# Patient Record
Sex: Female | Born: 1970 | Race: Asian | Hispanic: No | Marital: Married | Smoking: Current every day smoker
Health system: Southern US, Community
[De-identification: ages and names within clinical notes are randomized; demographics above are authoritative.]

## PROBLEM LIST (undated history)

## (undated) DIAGNOSIS — N200 Calculus of kidney: Secondary | ICD-10-CM

---

## 2014-11-16 ENCOUNTER — Emergency Department (HOSPITAL_COMMUNITY)
Admission: EM | Admit: 2014-11-16 | Discharge: 2014-11-17 | Disposition: A | Discharge status: Home / Self Care | Payer: PRIVATE HEALTH INSURANCE | Attending: Emergency Medicine | Admitting: Emergency Medicine

## 2014-11-16 ENCOUNTER — Encounter (HOSPITAL_COMMUNITY): Payer: Self-pay | Admitting: Emergency Medicine

## 2014-11-16 ENCOUNTER — Emergency Department (HOSPITAL_COMMUNITY): Payer: PRIVATE HEALTH INSURANCE

## 2014-11-16 DIAGNOSIS — Z720 Tobacco use: Secondary | ICD-10-CM | POA: Insufficient documentation

## 2014-11-16 DIAGNOSIS — R109 Unspecified abdominal pain: Secondary | ICD-10-CM

## 2014-11-16 DIAGNOSIS — N201 Calculus of ureter: Secondary | ICD-10-CM | POA: Diagnosis not present

## 2014-11-16 DIAGNOSIS — Z87442 Personal history of urinary calculi: Secondary | ICD-10-CM | POA: Diagnosis not present

## 2014-11-16 DIAGNOSIS — N1330 Unspecified hydronephrosis: Secondary | ICD-10-CM | POA: Diagnosis not present

## 2014-11-16 DIAGNOSIS — Z3202 Encounter for pregnancy test, result negative: Secondary | ICD-10-CM | POA: Diagnosis not present

## 2014-11-16 DIAGNOSIS — N132 Hydronephrosis with renal and ureteral calculous obstruction: Secondary | ICD-10-CM

## 2014-11-16 HISTORY — DX: Calculus of kidney: N20.0

## 2014-11-16 LAB — CBC
HEMATOCRIT: 37 % (ref 36.0–46.0)
Hemoglobin: 12.3 g/dL (ref 12.0–15.0)
MCH: 29.6 pg (ref 26.0–34.0)
MCHC: 33.2 g/dL (ref 30.0–36.0)
MCV: 89.2 fL (ref 78.0–100.0)
Platelets: 326 10*3/uL (ref 150–400)
RBC: 4.15 MIL/uL (ref 3.87–5.11)
RDW: 15 % (ref 11.5–15.5)
WBC: 11.1 10*3/uL — ABNORMAL HIGH (ref 4.0–10.5)

## 2014-11-16 LAB — URINALYSIS, ROUTINE W REFLEX MICROSCOPIC
BILIRUBIN URINE: NEGATIVE
Glucose, UA: NEGATIVE mg/dL
Ketones, ur: 15 mg/dL — AB
NITRITE: NEGATIVE
PH: 7 (ref 5.0–8.0)
PROTEIN: 30 mg/dL — AB
SPECIFIC GRAVITY, URINE: 1.02 (ref 1.005–1.030)
Urobilinogen, UA: 1 mg/dL (ref 0.0–1.0)

## 2014-11-16 LAB — COMPREHENSIVE METABOLIC PANEL
ALBUMIN: 3.7 g/dL (ref 3.5–5.0)
ALT: 13 U/L — ABNORMAL LOW (ref 14–54)
AST: 22 U/L (ref 15–41)
Alkaline Phosphatase: 57 U/L (ref 38–126)
Anion gap: 9 (ref 5–15)
BUN: 12 mg/dL (ref 6–20)
CO2: 28 mmol/L (ref 22–32)
Calcium: 9.5 mg/dL (ref 8.9–10.3)
Chloride: 101 mmol/L (ref 101–111)
Creatinine, Ser: 0.66 mg/dL (ref 0.44–1.00)
GFR calc Af Amer: >60 mL/min (ref >60)
Glucose, Bld: 93 mg/dL (ref 65–99)
Potassium: 3.5 mmol/L (ref 3.5–5.1)
SODIUM: 138 mmol/L (ref 135–145)
Total Bilirubin: 0.5 mg/dL (ref 0.3–1.2)
Total Protein: 6.3 g/dL — ABNORMAL LOW (ref 6.5–8.1)

## 2014-11-16 LAB — URINE MICROSCOPIC-ADD ON

## 2014-11-16 LAB — POC URINE PREG, ED: Preg Test, Ur: NEGATIVE

## 2014-11-16 LAB — LIPASE, BLOOD: Lipase: 21 U/L — ABNORMAL LOW (ref 22–51)

## 2014-11-16 MED ORDER — ONDANSETRON HCL 4 MG/2ML IJ SOLN
4.0000 mg | Freq: Once | INTRAMUSCULAR | Status: AC
  Administered 2014-11-16: 4 mg via INTRAVENOUS
  Filled 2014-11-16: qty 2

## 2014-11-16 MED ORDER — HYDROMORPHONE HCL 1 MG/ML IJ SOLN
1.0000 mg | Freq: Once | INTRAMUSCULAR | Status: AC
  Administered 2014-11-16: 1 mg via INTRAVENOUS
  Filled 2014-11-16: qty 1

## 2014-11-16 MED ORDER — HYDROMORPHONE HCL 1 MG/ML IJ SOLN
1.0000 mg | INTRAMUSCULAR | Status: DC | PRN
  Administered 2014-11-16: 1 mg via INTRAVENOUS
  Filled 2014-11-16: qty 1

## 2014-11-16 NOTE — ED Provider Notes (Signed)
CSN: [id number]     Arrival date & time 11/16/14  2056 History   First MD Initiated Contact with Patient 11/16/14 2210     Chief Complaint  Patient presents with  . Flank Pain     (Consider location/radiation/quality/duration/timing/severity/associated sxs/prior Treatment) HPI   PCP: No primary care provider on file. Blood pressure 115/73, pulse 89, temperature 98.1 F (36.7 C), temperature source Oral, resp. rate 24, height  (1.626 m), weight 120 lb (54.432 kg), last menstrual period 11/13/2014, SpO2 97 %.  Tiffany Hodges is a 44 y.o.female with a significant PMH of kidney stones presents to the ER with complaints of acute onset of flank pain this evening, urinary hesitency, and nausea without vomiting.  She was writhing in pain and given a dose of pain medication in triage, this helped her pain relieve to a 9/10 from a 10/10. Her nausea is also improved but has not resolved. The patient speaks [place] and is here visiting family from [place]. She has a hx of renal stones and passing stones in the past.   The patient denies diaphoresis, fever, headache, weakness (general or focal), confusion, change of vision,  neck pain, dysphagia, aphagia, chest pain, shortness of breath,  back pain,vomiting, diarrhea, lower extremity swelling, rash.   Past Medical History  Diagnosis Date  . Renal stones    History reviewed. No pertinent past surgical history. No family history on file. History  Substance Use Topics  . Smoking status: Current Every Day Smoker  . Smokeless tobacco: Not on file  . Alcohol Use: Yes   OB History    No data available     Review of Systems  10 Systems reviewed and are negative for acute change except as noted in the HPI.   Allergies  Review of patient's allergies indicates no known allergies.  Home Medications   Prior to Admission medications   Medication Sig Start Date End Date Taking? Authorizing Provider  ciprofloxacin (CIPRO) 500 MG tablet Take 1  tablet (500 mg total) by mouth 2 (two) times daily. 11/17/14   Mistie Adney Neva Seat, PA-C  ondansetron (ZOFRAN) 4 MG tablet Take 1 tablet (4 mg total) by mouth every 6 (six) hours. 11/17/14   Marlon Pel, PA-C  oxyCODONE-acetaminophen (PERCOCET/ROXICET) 5-325 MG per tablet Take 1-2 tablets by mouth every 6 (six) hours as needed. 11/17/14   Keone Kamer Neva Seat, PA-C   BP 114/84 mmHg  Pulse 68  Temp(Src) 98.1 F (36.7 C) (Oral)  Resp 24  Ht  (1.626 m)  Wt 120 lb (54.432 kg)  BMI 20.59 kg/m2  SpO2 95%  LMP 11/13/2014 Physical Exam  Constitutional: She appears well-developed and well-nourished. No distress.  HENT:  Head: Normocephalic and atraumatic.  Eyes: Pupils are equal, round, and reactive to light.  Neck: Normal range of motion. Neck supple.  Cardiovascular: Normal rate and regular rhythm.   Pulmonary/Chest: Effort normal.  Abdominal: Soft. Bowel sounds are normal. There is no tenderness. There is CVA tenderness (left). There is no rigidity and no guarding.  Neurological: She is alert.  Skin: Skin is warm and dry. She is not diaphoretic.  Nursing note and vitals reviewed.   ED Course  Procedures (including critical care time) Labs Review Labs Reviewed  COMPREHENSIVE METABOLIC PANEL - Abnormal; Notable for the following:    Total Protein 6.3 (*)    ALT 13 (*)    All other components within normal limits  CBC - Abnormal; Notable for the following:    WBC 11.1 (*)  All other components within normal limits  LIPASE, BLOOD - Abnormal; Notable for the following:    Lipase 21 (*)    All other components within normal limits  URINALYSIS, ROUTINE W REFLEX MICROSCOPIC (NOT AT [facility]) - Abnormal; Notable for the following:    Color, Urine RED (*)    APPearance TURBID (*)    Hgb urine dipstick LARGE (*)    Ketones, ur 15 (*)    Protein, ur 30 (*)    Leukocytes, UA MODERATE (*)    All other components within normal limits  URINE CULTURE  URINE MICROSCOPIC-ADD ON  POC URINE PREG, ED     Imaging Review Ct Abdomen Pelvis Wo Contrast  11/16/2014   CLINICAL DATA:  Acute onset of worsening left flank pain. Initial encounter.  EXAM: CT ABDOMEN AND PELVIS WITHOUT CONTRAST  TECHNIQUE: Multidetector CT imaging of the abdomen and pelvis was performed following the standard protocol without IV contrast.  COMPARISON:  None.  FINDINGS: Minimal bibasilar atelectasis is noted.  The liver and spleen are unremarkable in appearance. The gallbladder is within normal limits. The pancreas and adrenal glands are unremarkable.  There is mild left-sided hydronephrosis, with minimal left-sided perinephric stranding, and prominence of the left ureter along its entire course. An obstructing 6 x 5 mm stone is noted in the distal left ureter, 1-2 cm above the left vesicoureteral junction.  The right kidney is unremarkable in appearance. No nonobstructing renal stones are identified.  No free fluid is identified. The small bowel is unremarkable in appearance. The stomach is within normal limits. No acute vascular abnormalities are seen.  The appendix is normal in caliber and contains trace air, without evidence of appendicitis. The colon is unremarkable in appearance.  The bladder is relatively decompressed and grossly unremarkable. The uterus is within normal limits. The ovaries are relatively symmetric. No suspicious adnexal masses are seen. No inguinal lymphadenopathy is seen.  No acute osseous abnormalities are identified.  IMPRESSION: Mild left-sided hydronephrosis, with prominence of the left ureter along its entire course. Obstructing 6 x 5 mm stone in the distal left ureter, 1-2 cm above the left vesicoureteral junction.   Electronically Signed   By: Roanna Raider M.D.   On: 11/16/2014 23:41     EKG Interpretation None      MDM   Final diagnoses:  Left flank pain  Ureteral stone with hydronephrosis    Pt has moderate Leukocytes but negative nitrites and only 3-6 WBC, due to stone will cover with  Cipro. Her CT scan shows 6 x 5 mm stone near the distal ureter. Mild hydronephrosis associated. The patient is returning home to [place] in a few days.  Pain controlled in the ED with IV analgesics.   Her labs are otherwise unremarkable.  Medications  oxyCODONE-acetaminophen (PERCOCET/ROXICET) 5-325 MG per tablet 2 tablet (not administered)  ondansetron (ZOFRAN) injection 4 mg (4 mg Intravenous Given 11/16/14 2133)  HYDROmorphone (DILAUDID) injection 1 mg (1 mg Intravenous Given 11/16/14 2250)  ondansetron (ZOFRAN) injection 4 mg (4 mg Intravenous Given 11/16/14 2250)    44 y.o.Tiffany Hodges's evaluation in the Emergency Department is complete. It has been determined that no acute conditions requiring further emergency intervention are present at this time. The patient/guardian have been advised of the diagnosis and plan. We have discussed signs and symptoms that warrant return to the ED, such as changes or worsening in symptoms.  Vital signs are stable at discharge. Filed Vitals:   11/16/14 2329  BP:  Pulse: 68  Temp:   Resp:     Patient/guardian has voiced understanding and agreed to follow-up with the PCP or specialist.     Marlon Pel, PA-C 11/17/14 0005  Linwood Dibbles, MD 11/17/14 1228

## 2014-11-16 NOTE — ED Notes (Signed)
Pt. reports worsening left flank pain/left lateral abdominal pain with nausea onset this week , history of renal stones , denies hematuria / mild dysuria , no fever or chills.

## 2014-11-17 DIAGNOSIS — N201 Calculus of ureter: Secondary | ICD-10-CM | POA: Diagnosis not present

## 2014-11-17 MED ORDER — ONDANSETRON HCL 4 MG PO TABS: 4.0000 mg | ORAL_TABLET | Freq: Four times a day (QID) | ORAL | Status: DC

## 2014-11-17 MED ORDER — OXYCODONE-ACETAMINOPHEN 5-325 MG PO TABS: 1.0000 | ORAL_TABLET | Freq: Four times a day (QID) | ORAL | Status: AC | PRN

## 2014-11-17 MED ORDER — METOCLOPRAMIDE HCL 5 MG/ML IJ SOLN
10.0000 mg | Freq: Once | INTRAMUSCULAR | Status: AC
  Administered 2014-11-17: 10 mg via INTRAVENOUS
  Filled 2014-11-17: qty 2

## 2014-11-17 MED ORDER — PROMETHAZINE HCL 25 MG/ML IJ SOLN
25.0000 mg | Freq: Once | INTRAMUSCULAR | Status: DC
  Filled 2014-11-17: qty 1

## 2014-11-17 MED ORDER — OXYCODONE-ACETAMINOPHEN 5-325 MG PO TABS
2.0000 | ORAL_TABLET | Freq: Once | ORAL | Status: DC
  Filled 2014-11-17: qty 2

## 2014-11-17 MED ORDER — OXYCODONE-ACETAMINOPHEN 5-325 MG PO TABS: 1.0000 | ORAL_TABLET | Freq: Four times a day (QID) | ORAL | Status: DC | PRN

## 2014-11-17 MED ORDER — PROMETHAZINE HCL 25 MG/ML IJ SOLN
12.5000 mg | Freq: Once | INTRAMUSCULAR | Status: DC
  Filled 2014-11-17: qty 1

## 2014-11-17 MED ORDER — CIPROFLOXACIN HCL 500 MG PO TABS: 500.0000 mg | ORAL_TABLET | Freq: Two times a day (BID) | ORAL | Status: DC

## 2014-11-17 MED ORDER — ONDANSETRON HCL 4 MG PO TABS: 4.0000 mg | ORAL_TABLET | Freq: Four times a day (QID) | ORAL | Status: AC

## 2014-11-17 MED ORDER — CIPROFLOXACIN HCL 500 MG PO TABS: 500.0000 mg | ORAL_TABLET | Freq: Two times a day (BID) | ORAL | Status: AC

## 2014-11-17 NOTE — Discharge Instructions (Signed)
Hydronephrosis °Hydronephrosis is an abnormal enlargement of your kidney. It can affect one or both the kidneys. It results from the backward pressure of urine on the kidneys, when the flow of urine is blocked. Normally, the urine drains from the kidney through the urine tube (ureter), into a sac which holds the urine until urination (bladder). When the urinary flow is blocked, the urine collects above the block. This causes an increase in the pressure inside the kidney, which in turn leads to its enlargement. The block can occur at the point where the kidney joins the ureter. Treatment depends on the cause and location of the block.  °CAUSES  °The causes of this condition include: °· Birth defect of the kidney or ureter. °· Kink at the point where the kidney joins the ureter. °· Stones and blood clots in the kidney or ureter. °· Cancer, injury, or infection of the ureter. °· Scar tissue formation. °· Backflow of urine (reflux). °· Cancer of bladder or prostate gland. °· Abnormality of the nerves or muscles of the kidney or ureter. °· Lower part of the ureter protruding into the bladder (ureterocele). °· Abnormal contractions of the bladder. °· Both the kidneys can be affected during pregnancy. This is because the enlarging uterus presses on the ureters and blocks the flow of urine. °SYMPTOMS  °The symptoms depend on the location of the block. They also depend on how long the block has been present. You may feel pain on the affected side. Sometimes, you may not have any symptoms. There may be a dull ache or discomfort in the flank. The common symptoms are: °· Flank pain. °· Swelling of the abdomen. °· Pain in the abdomen. °· Nausea and vomiting. °· Fever. °· Pain while passing urine. °· Urgency for urination. °· Frequent or urgent urination. °· Infection of the urinary tract. °DIAGNOSIS  °Your caregiver will examine you after asking about your symptoms. You may be asked to do blood and urine tests. Your caregiver  may order a special X-ray, ultrasound, or CT scan. Sometimes a rigid or flexible telescope (cystoscope) is used to view the site of the blockage.  °TREATMENT  °Treatment depends on the site, cause, and duration of the block. The goal of treatment is to remove the blockage. Your caregiver will plan the treatment based on your condition. The different types of treatment are:  °· Putting in a soft plastic tube (ureteral stent) to connect the bladder with the kidney. This will help in draining the urine. °· Putting in a soft tube (nephrostomy tube). This is placed through skin into the kidney. The trapped urine is drained out through the back. A plastic bag is attached to your skin to hold the urine that has drained out. °· Antibiotics to treat or prevent infection. °· Breaking down of the stone (lithotripsy). °HOME CARE INSTRUCTIONS  °· It may take some time for the hydronephrosis to go away (resolve). Drink fluids as directed by your caregiver , and get a lot of rest. °· If you have a drain in, your caregiver will give you directions about how to care for it. Be sure you understand these directions completely before you go home. °· Take any antibiotics, pain medications, or other prescriptions exactly as prescribed. °· Follow-up with your caregivers as directed. °SEEK MEDICAL CARE IF:  °· You continue to have flank pain, nausea, or difficulty with urination. °· You have any problem with any type of drainage device. °· Your urine becomes cloudy or bloody. °SEEK   IMMEDIATE MEDICAL CARE IF:  °· You have severe flank and/or abdominal pain. °· You develop vomiting and are unable to hold down fluids. °· You develop a fever above 100.5° F (38.1° C), or as per your caregiver. °MAKE SURE YOU:  °· Understand these instructions. °· Will watch your condition. °· Will get help right away if you are not doing well or get worse. °Document Released: 03/13/2007 Document Revised: 08/08/2011 Document Reviewed: 04/29/2010 °ExitCare®  Patient Information ©2015 [employer]. This information is not intended to replace advice given to you by your health care provider. Make sure you discuss any questions you have with your health care provider. ° °Kidney Stones °Kidney stones (urolithiasis) are deposits that form inside your kidneys. The intense pain is caused by the stone moving through the urinary tract. When the stone moves, the ureter goes into spasm around the stone. The stone is usually passed in the urine.  °CAUSES  °· A disorder that makes certain neck glands produce too much parathyroid hormone (primary hyperparathyroidism). °· A buildup of uric acid crystals, similar to gout in your joints. °· Narrowing (stricture) of the ureter. °· A kidney obstruction present at birth (congenital obstruction). °· Previous surgery on the kidney or ureters. °· Numerous kidney infections. °SYMPTOMS  °· Feeling sick to your stomach (nauseous). °· Throwing up (vomiting). °· Blood in the urine (hematuria). °· Pain that usually spreads (radiates) to the groin. °· Frequency or urgency of urination. °DIAGNOSIS  °· Taking a history and physical exam. °· Blood or urine tests. °· CT scan. °· Occasionally, an examination of the inside of the urinary bladder (cystoscopy) is performed. °TREATMENT  °· Observation. °· Increasing your fluid intake. °· Extracorporeal shock wave lithotripsy--This is a noninvasive procedure that uses shock waves to break up kidney stones. °· Surgery may be needed if you have severe pain or persistent obstruction. There are various surgical procedures. Most of the procedures are performed with the use of small instruments. Only small incisions are needed to accommodate these instruments, Ramani recovery time is minimized. °The size, location, and chemical composition are all important variables that will determine the proper choice of action for you. Talk to your health care provider to better understand your situation Zooey that you will minimize  the risk of injury to yourself and your kidney.  °HOME CARE INSTRUCTIONS  °· Drink enough water and fluids to keep your urine clear or pale yellow. This will help you to pass the stone or stone fragments. °· Strain all urine through the provided strainer. Keep all particulate matter and stones for your health care provider to see. The stone causing the pain may be as small as a grain of salt. It is very important to use the strainer each and every time you pass your urine. The collection of your stone will allow your health care provider to analyze it and verify that a stone has actually passed. The stone analysis will often identify what you can do to reduce the incidence of recurrences. °· Only take over-the-counter or prescription medicines for pain, discomfort, or fever as directed by your health care provider. °· Make a follow-up appointment with your health care provider as directed. °· Get follow-up X-rays if required. The absence of pain does not always mean that the stone has passed. It may have only stopped moving. If the urine remains completely obstructed, it can cause loss of kidney function or even complete destruction of the kidney. It is your responsibility to make sure   X-rays and follow-ups are completed. Ultrasounds of the kidney can show blockages and the status of the kidney. Ultrasounds are not associated with any radiation and can be performed easily in a matter of minutes. °SEEK MEDICAL CARE IF: °· You experience pain that is progressive and unresponsive to any pain medicine you have been prescribed. °SEEK IMMEDIATE MEDICAL CARE IF:  °· Pain cannot be controlled with the prescribed medicine. °· You have a fever or shaking chills. °· The severity or intensity of pain increases over 18 hours and is not relieved by pain medicine. °· You develop a new onset of abdominal pain. °· You feel faint or pass out. °· You are unable to urinate. °MAKE SURE YOU:  °· Understand these instructions. °· Will  watch your condition. °· Will get help right away if you are not doing well or get worse. °Document Released: 05/16/2005 Document Revised: 01/16/2013 Document Reviewed: 10/17/2012 °ExitCare® Patient Information ©2015 [employer]. This information is not intended to replace advice given to you by your health care provider. Make sure you discuss any questions you have with your health care provider. ° °

## 2014-11-21 LAB — URINE CULTURE: Culture: 50000

## 2014-11-23 ENCOUNTER — Telehealth (HOSPITAL_COMMUNITY): Payer: Self-pay

## 2014-11-23 NOTE — Telephone Encounter (Signed)
Post ED Visit - Positive Culture Follow-up  Culture report reviewed by antimicrobial stewardship pharmacist: []  Wes Dulaney, Pharm.D., BCPS []  Celedonio Miyamoto, [address].D., BCPS []  Georgina Pillion, Pharm.D., BCPS []  [place], [place].D., BCPS, AAHIVP []  Estella Husk, Pharm.D., BCPS, AAHIVP []  Elder Cyphers, [address].D., BCPS X  Enzo Bi, Pharm D  Positive Urine culture, >/= 50,000 colonies -> Enterococcus Species Treated with Ciprofloxacin, organism sensitive to the same and no further patient follow-up is required at this time.  Arvid Right 11/23/2014, 5:27 AM

## 2016-05-08 IMAGING — CT CT ABD-PELV W/O CM
2 of 3 series · 14 of 42 positions shown, 18 images · non-contrast
Comparison: None.

CLINICAL DATA: Acute onset of worsening left flank pain. Initial
encounter.

EXAM:
CT ABDOMEN AND PELVIS WITHOUT CONTRAST
TECHNIQUE: Multidetector CT imaging of the abdomen and pelvis was performed
following the standard protocol without IV contrast.

[Series 2: stone study 5.0 i30f 1 · axial · 0.67mm/px · z∈[+742,+1147]mm · 11 of 93 slices shown, 15 images]
[im 8/93  soft-tissue]
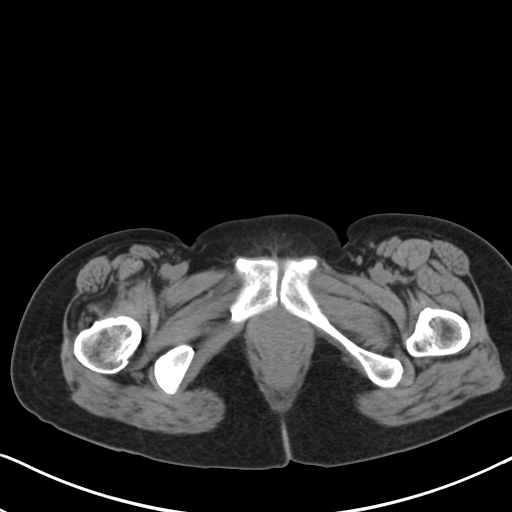
[im 8/93  bone]
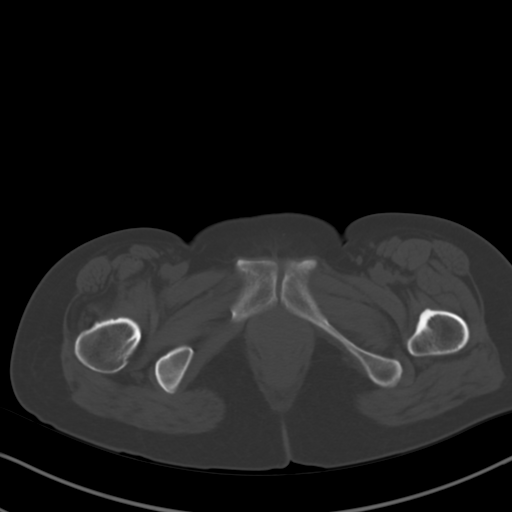
[im 16/93  soft-tissue]
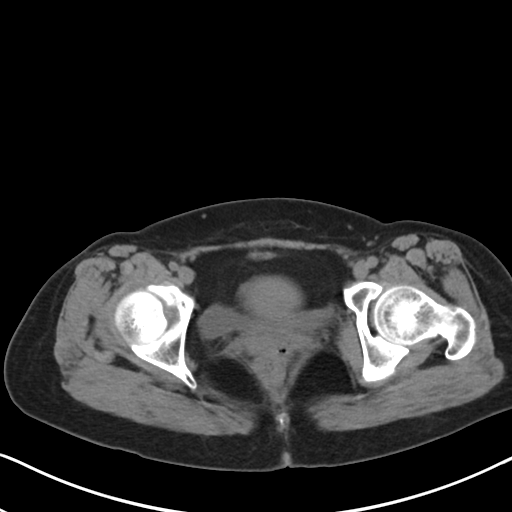
[im 27/93  soft-tissue]
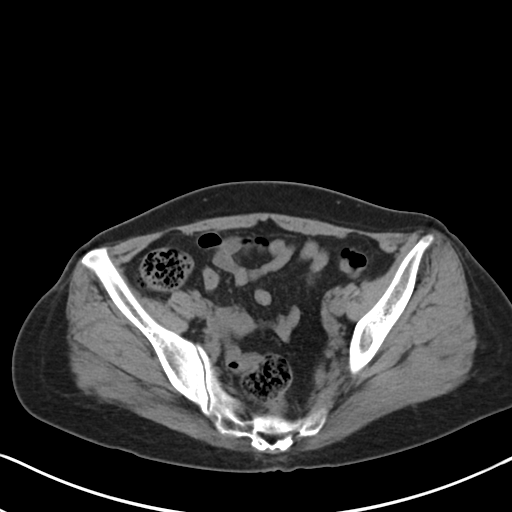
[im 35/93  soft-tissue]
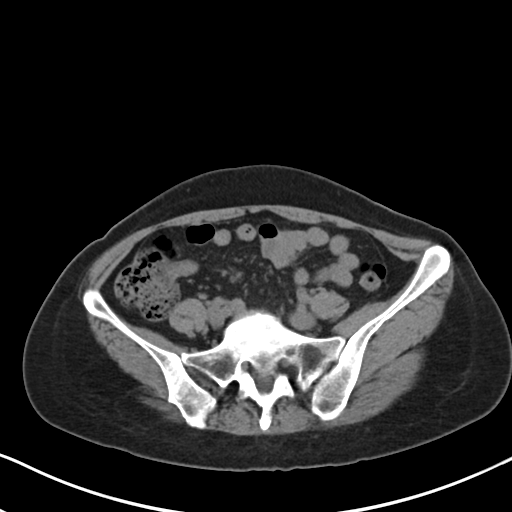
[im 47/93  soft-tissue]
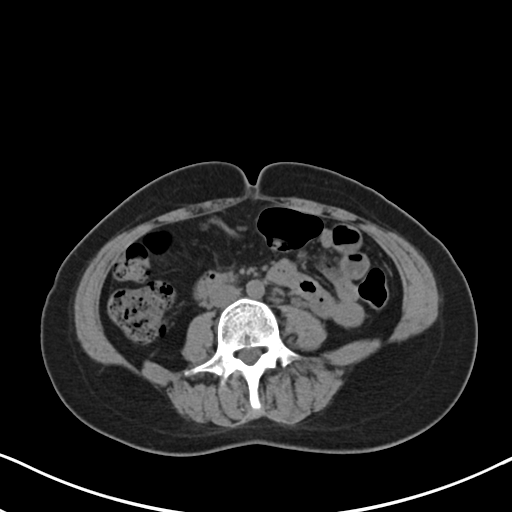
[im 58/93  soft-tissue]
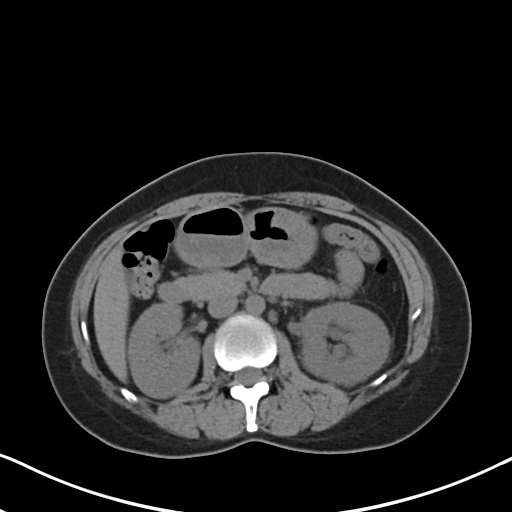
[im 66/93  soft-tissue]
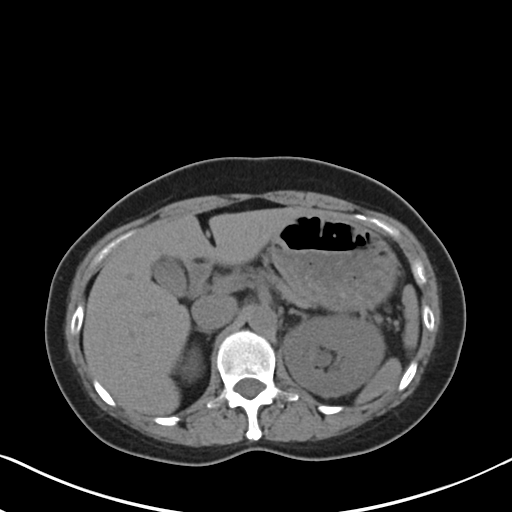
[im 77/93  soft-tissue]
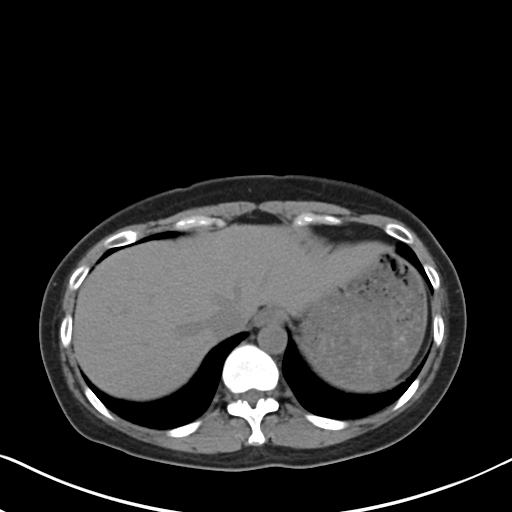
[im 77/93  lung]
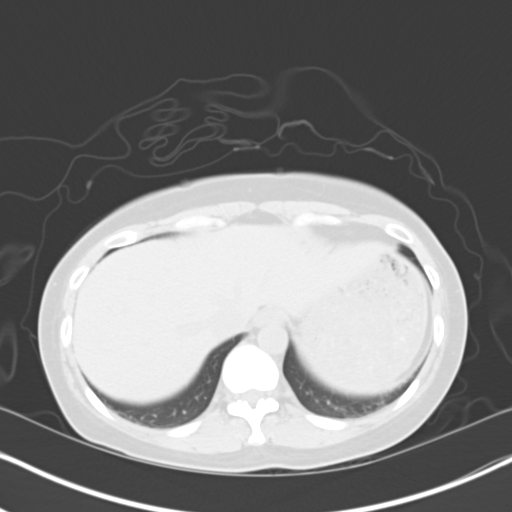
[im 81/93  lung]
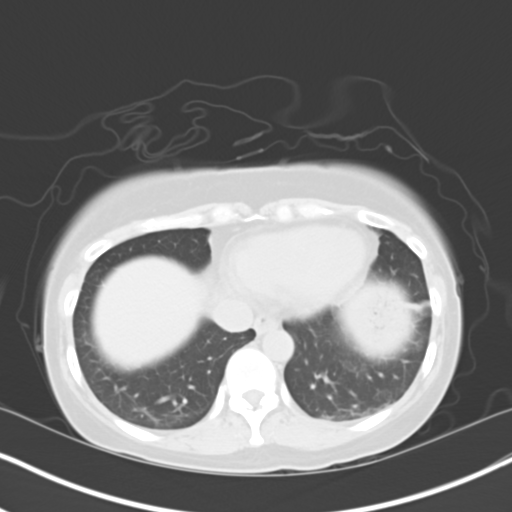
[im 85/93  soft-tissue]
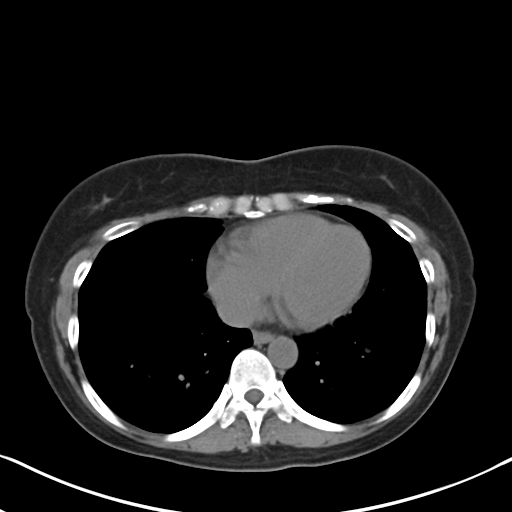
[im 85/93  lung]
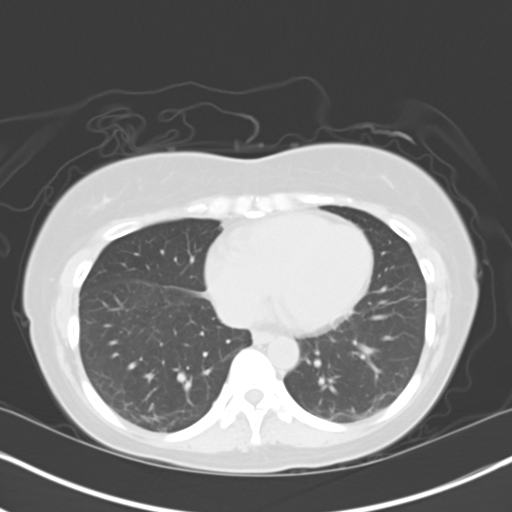
[im 85/93  bone]
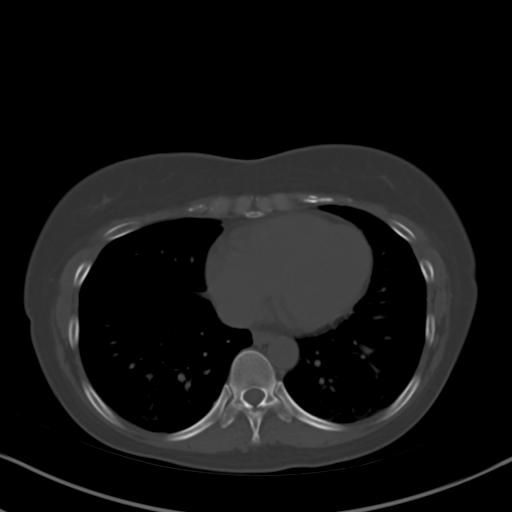
[im 89/93  lung]
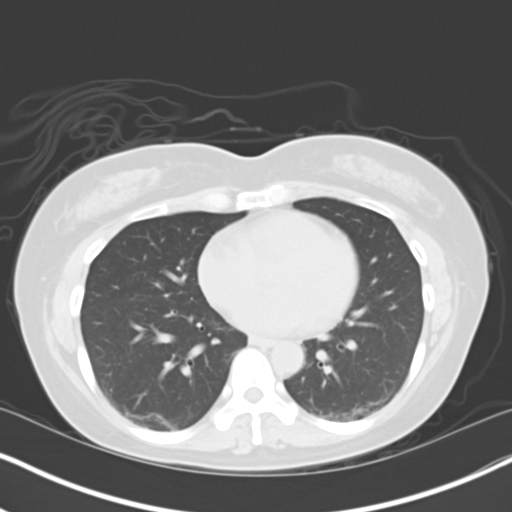

[Series 5: coronal soft tissue · coronal · 0.66mm/px · 3 of 78 slices shown]
[im 26/78  soft-tissue]
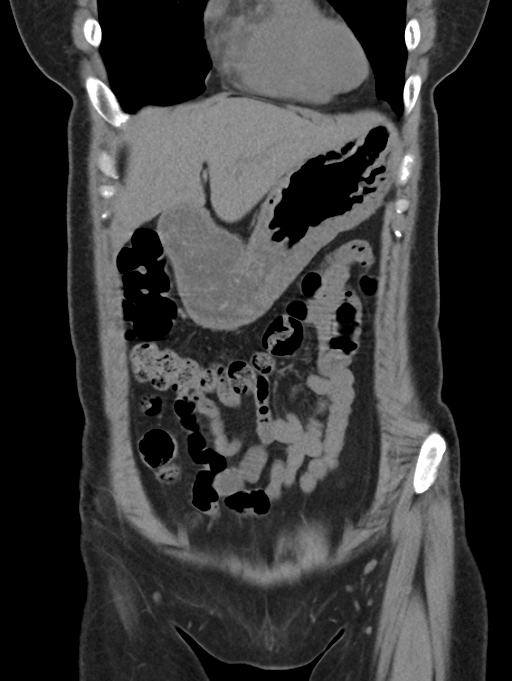
[im 35/78  soft-tissue]
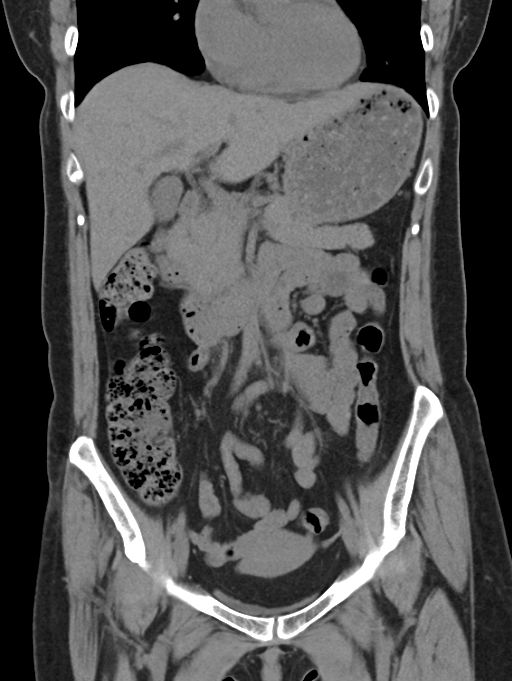
[im 43/78  soft-tissue]
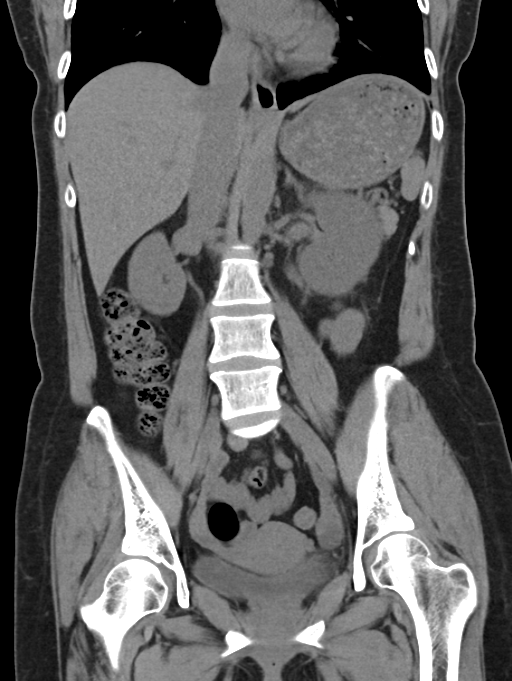

[14 of 42 positions shown; findings below may reference images not displayed]

FINDINGS: Minimal bibasilar atelectasis is noted.

The liver and spleen are unremarkable in appearance. The gallbladder
is within normal limits. The pancreas and adrenal glands are
unremarkable.

There is mild left-sided hydronephrosis, with minimal left-sided
perinephric stranding, and prominence of the left ureter along its
entire course. An obstructing 6 x 5 mm stone is noted in the distal
left ureter, 1-2 cm above the left vesicoureteral junction.

The right kidney is unremarkable in appearance. No nonobstructing
renal stones are identified.

No free fluid is identified. The small bowel is unremarkable in
appearance. The stomach is within normal limits. No acute vascular
abnormalities are seen.

The appendix is normal in caliber and contains trace air, without
evidence of appendicitis. The colon is unremarkable in appearance.

The bladder is relatively decompressed and grossly unremarkable. The
uterus is within normal limits. The ovaries are relatively
symmetric. No suspicious adnexal masses are seen. No inguinal
lymphadenopathy is seen.

No acute osseous abnormalities are identified.
IMPRESSION: Mild left-sided hydronephrosis, with prominence of the left ureter
along its entire course. Obstructing 6 x 5 mm stone in the distal
left ureter, 1-2 cm above the left vesicoureteral junction.
